# Patient Record
Sex: Male | Born: 1960 | Race: White | Hispanic: No | Marital: Single | Smoking: Current every day smoker
Health system: Southern US, Community
[De-identification: ages and names within clinical notes are randomized; demographics above are authoritative.]

## PROBLEM LIST (undated history)

## (undated) DIAGNOSIS — I10 Essential (primary) hypertension: Secondary | ICD-10-CM

## (undated) DIAGNOSIS — R7303 Prediabetes: Secondary | ICD-10-CM

## (undated) DIAGNOSIS — M1990 Unspecified osteoarthritis, unspecified site: Secondary | ICD-10-CM

## (undated) DIAGNOSIS — K219 Gastro-esophageal reflux disease without esophagitis: Secondary | ICD-10-CM

## (undated) HISTORY — PX: CARDIAC CATHETERIZATION: SHX172

---

## 2014-02-27 ENCOUNTER — Emergency Department: Payer: Self-pay | Admitting: Emergency Medicine

## 2018-06-08 ENCOUNTER — Observation Stay
Admission: EM | Admit: 2018-06-08 | Discharge: 2018-06-09 | Disposition: A | Discharge status: Home / Self Care | Payer: BLUE CROSS/BLUE SHIELD | Attending: Internal Medicine | Admitting: Internal Medicine

## 2018-06-08 ENCOUNTER — Other Ambulatory Visit: Payer: Self-pay

## 2018-06-08 ENCOUNTER — Emergency Department: Payer: BLUE CROSS/BLUE SHIELD

## 2018-06-08 ENCOUNTER — Encounter: Payer: Self-pay | Admitting: Emergency Medicine

## 2018-06-08 DIAGNOSIS — R0789 Other chest pain: Principal | ICD-10-CM | POA: Insufficient documentation

## 2018-06-08 DIAGNOSIS — R079 Chest pain, unspecified: Secondary | ICD-10-CM | POA: Diagnosis present

## 2018-06-08 DIAGNOSIS — Z7982 Long term (current) use of aspirin: Secondary | ICD-10-CM | POA: Insufficient documentation

## 2018-06-08 DIAGNOSIS — F17210 Nicotine dependence, cigarettes, uncomplicated: Secondary | ICD-10-CM | POA: Diagnosis not present

## 2018-06-08 DIAGNOSIS — E785 Hyperlipidemia, unspecified: Secondary | ICD-10-CM | POA: Insufficient documentation

## 2018-06-08 DIAGNOSIS — F419 Anxiety disorder, unspecified: Secondary | ICD-10-CM | POA: Diagnosis not present

## 2018-06-08 DIAGNOSIS — Z6833 Body mass index (BMI) 33.0-33.9, adult: Secondary | ICD-10-CM | POA: Insufficient documentation

## 2018-06-08 DIAGNOSIS — K219 Gastro-esophageal reflux disease without esophagitis: Secondary | ICD-10-CM | POA: Insufficient documentation

## 2018-06-08 DIAGNOSIS — E119 Type 2 diabetes mellitus without complications: Secondary | ICD-10-CM

## 2018-06-08 DIAGNOSIS — Z79899 Other long term (current) drug therapy: Secondary | ICD-10-CM | POA: Insufficient documentation

## 2018-06-08 DIAGNOSIS — I10 Essential (primary) hypertension: Secondary | ICD-10-CM | POA: Insufficient documentation

## 2018-06-08 HISTORY — DX: Prediabetes: R73.03

## 2018-06-08 HISTORY — DX: Gastro-esophageal reflux disease without esophagitis: K21.9

## 2018-06-08 HISTORY — DX: Unspecified osteoarthritis, unspecified site: M19.90

## 2018-06-08 HISTORY — DX: Essential (primary) hypertension: I10

## 2018-06-08 LAB — COMPREHENSIVE METABOLIC PANEL
ALT: 27 U/L (ref 0–44)
ANION GAP: 8 (ref 5–15)
AST: 21 U/L (ref 15–41)
Albumin: 4 g/dL (ref 3.5–5.0)
Alkaline Phosphatase: 75 U/L (ref 38–126)
BILIRUBIN TOTAL: 0.6 mg/dL (ref 0.3–1.2)
BUN: 19 mg/dL (ref 6–20)
CO2: 27 mmol/L (ref 22–32)
Calcium: 9.2 mg/dL (ref 8.9–10.3)
Chloride: 104 mmol/L (ref 98–111)
Creatinine, Ser: 1.03 mg/dL (ref 0.61–1.24)
GFR calc Af Amer: >60 mL/min (ref >60)
GFR calc non Af Amer: >60 mL/min (ref >60)
GLUCOSE: 86 mg/dL (ref 70–99)
POTASSIUM: 3.5 mmol/L (ref 3.5–5.1)
SODIUM: 139 mmol/L (ref 135–145)
TOTAL PROTEIN: 7.5 g/dL (ref 6.5–8.1)

## 2018-06-08 LAB — CBC WITH DIFFERENTIAL/PLATELET
Basophils Absolute: 0.1 10*3/uL (ref 0–0.1)
Basophils Relative: 1 %
EOS ABS: 0.4 10*3/uL (ref 0–0.7)
Eosinophils Relative: 4 %
HCT: 48.1 % (ref 40.0–52.0)
HEMOGLOBIN: 16.7 g/dL (ref 13.0–18.0)
LYMPHS ABS: 3.1 10*3/uL (ref 1.0–3.6)
LYMPHS PCT: 29 %
MCH: 31.6 pg (ref 26.0–34.0)
MCHC: 34.6 g/dL (ref 32.0–36.0)
MCV: 91.3 fL (ref 80.0–100.0)
MONOS PCT: 9 %
Monocytes Absolute: 0.9 10*3/uL (ref 0.2–1.0)
NEUTROS ABS: 6 10*3/uL (ref 1.4–6.5)
NEUTROS PCT: 57 %
Platelets: 237 10*3/uL (ref 150–440)
RBC: 5.27 MIL/uL (ref 4.40–5.90)
RDW: 13.7 % (ref 11.5–14.5)
WBC: 10.5 10*3/uL (ref 3.8–10.6)

## 2018-06-08 LAB — TROPONIN I
Troponin I: <0.03 ng/mL (ref <0.03)
Troponin I: <0.03 ng/mL (ref <0.03)

## 2018-06-08 LAB — BRAIN NATRIURETIC PEPTIDE: B Natriuretic Peptide: 29 pg/mL (ref 0.0–100.0)

## 2018-06-08 LAB — GLUCOSE, CAPILLARY: Glucose-Capillary: 174 mg/dL — ABNORMAL HIGH (ref 70–99)

## 2018-06-08 LAB — LIPASE, BLOOD: Lipase: 31 U/L (ref 11–51)

## 2018-06-08 IMAGING — CR DG CHEST 2V
2 series · 2 of 2 positions shown · non-contrast
Comparison: None.

CLINICAL DATA: Chest pain.

EXAM:
CHEST - 2 VIEW

[chest pa]
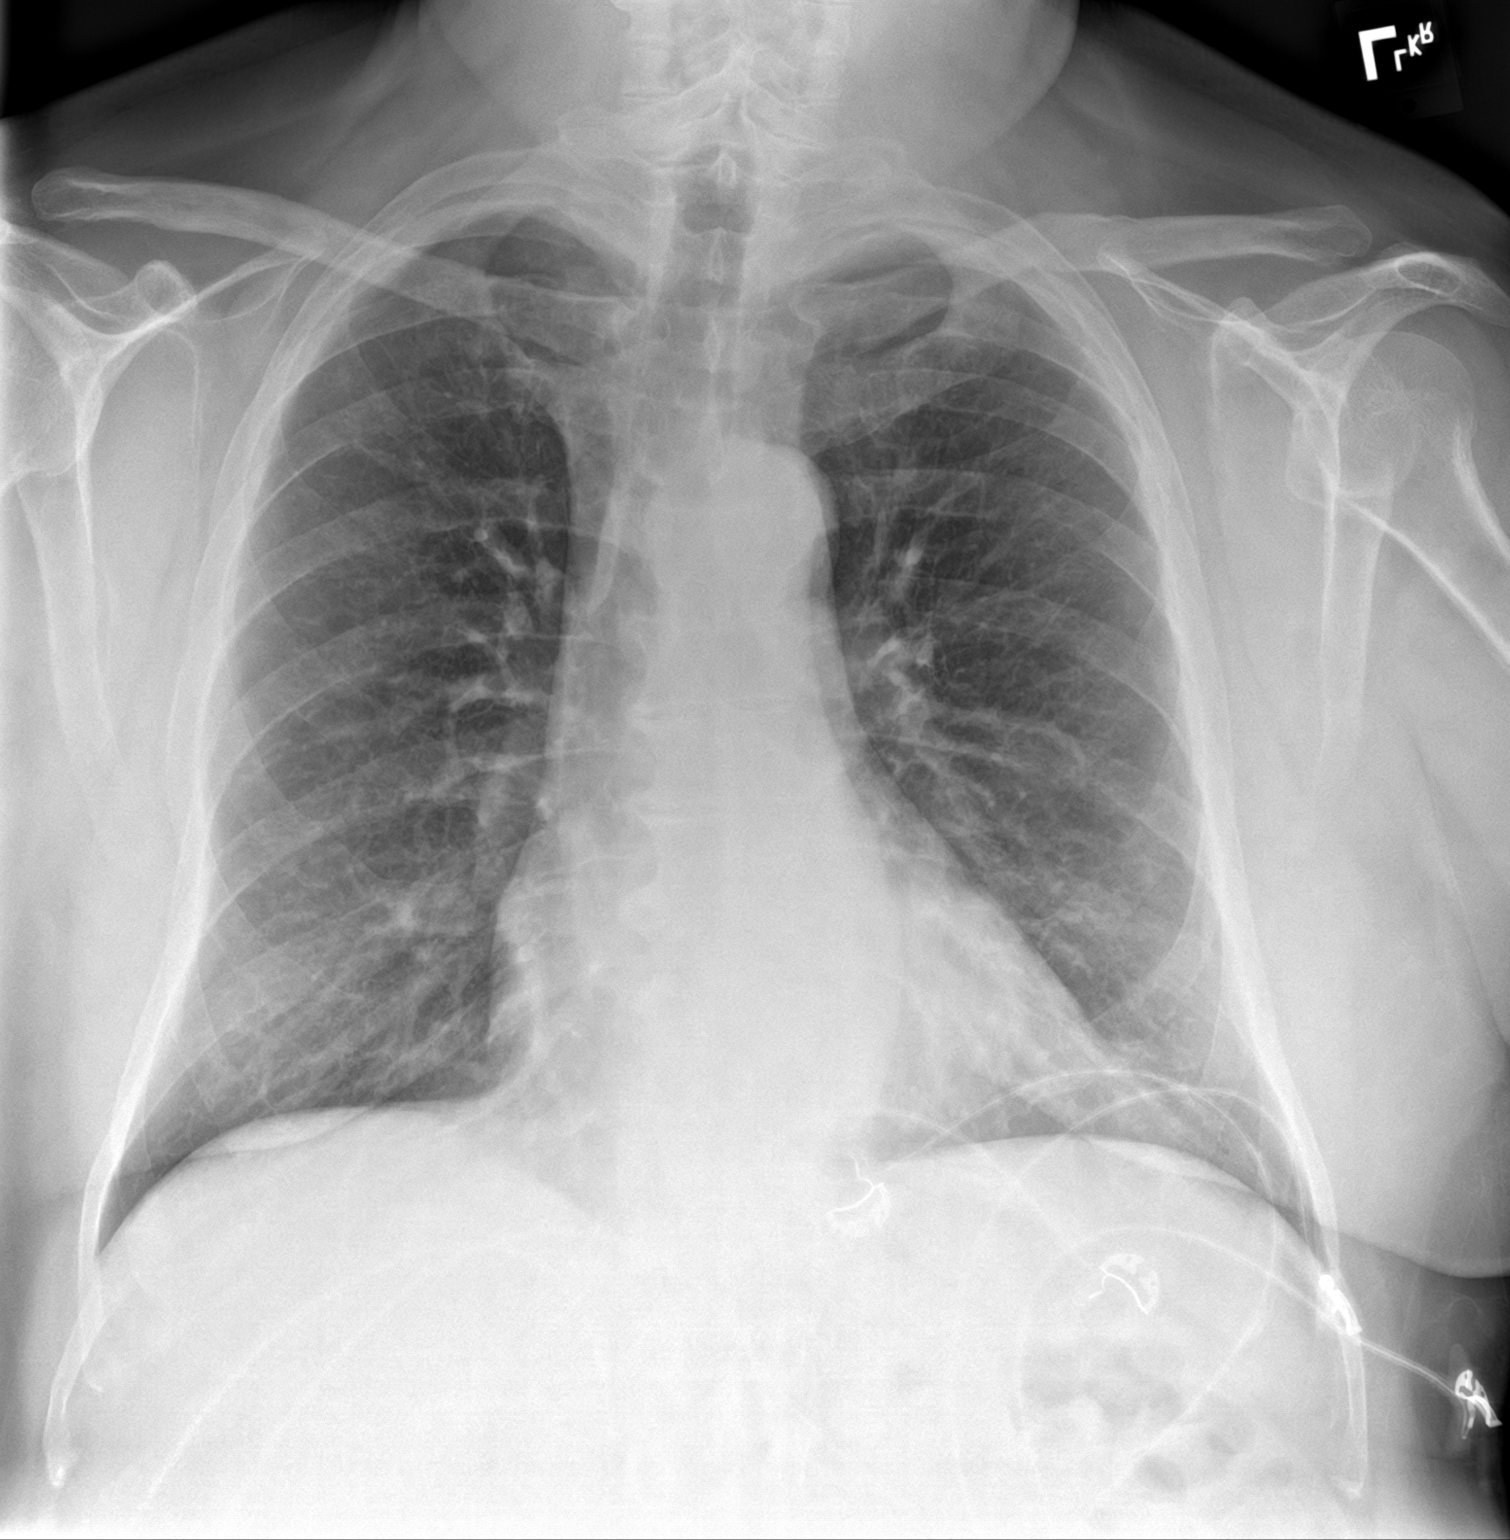

[chest lat]
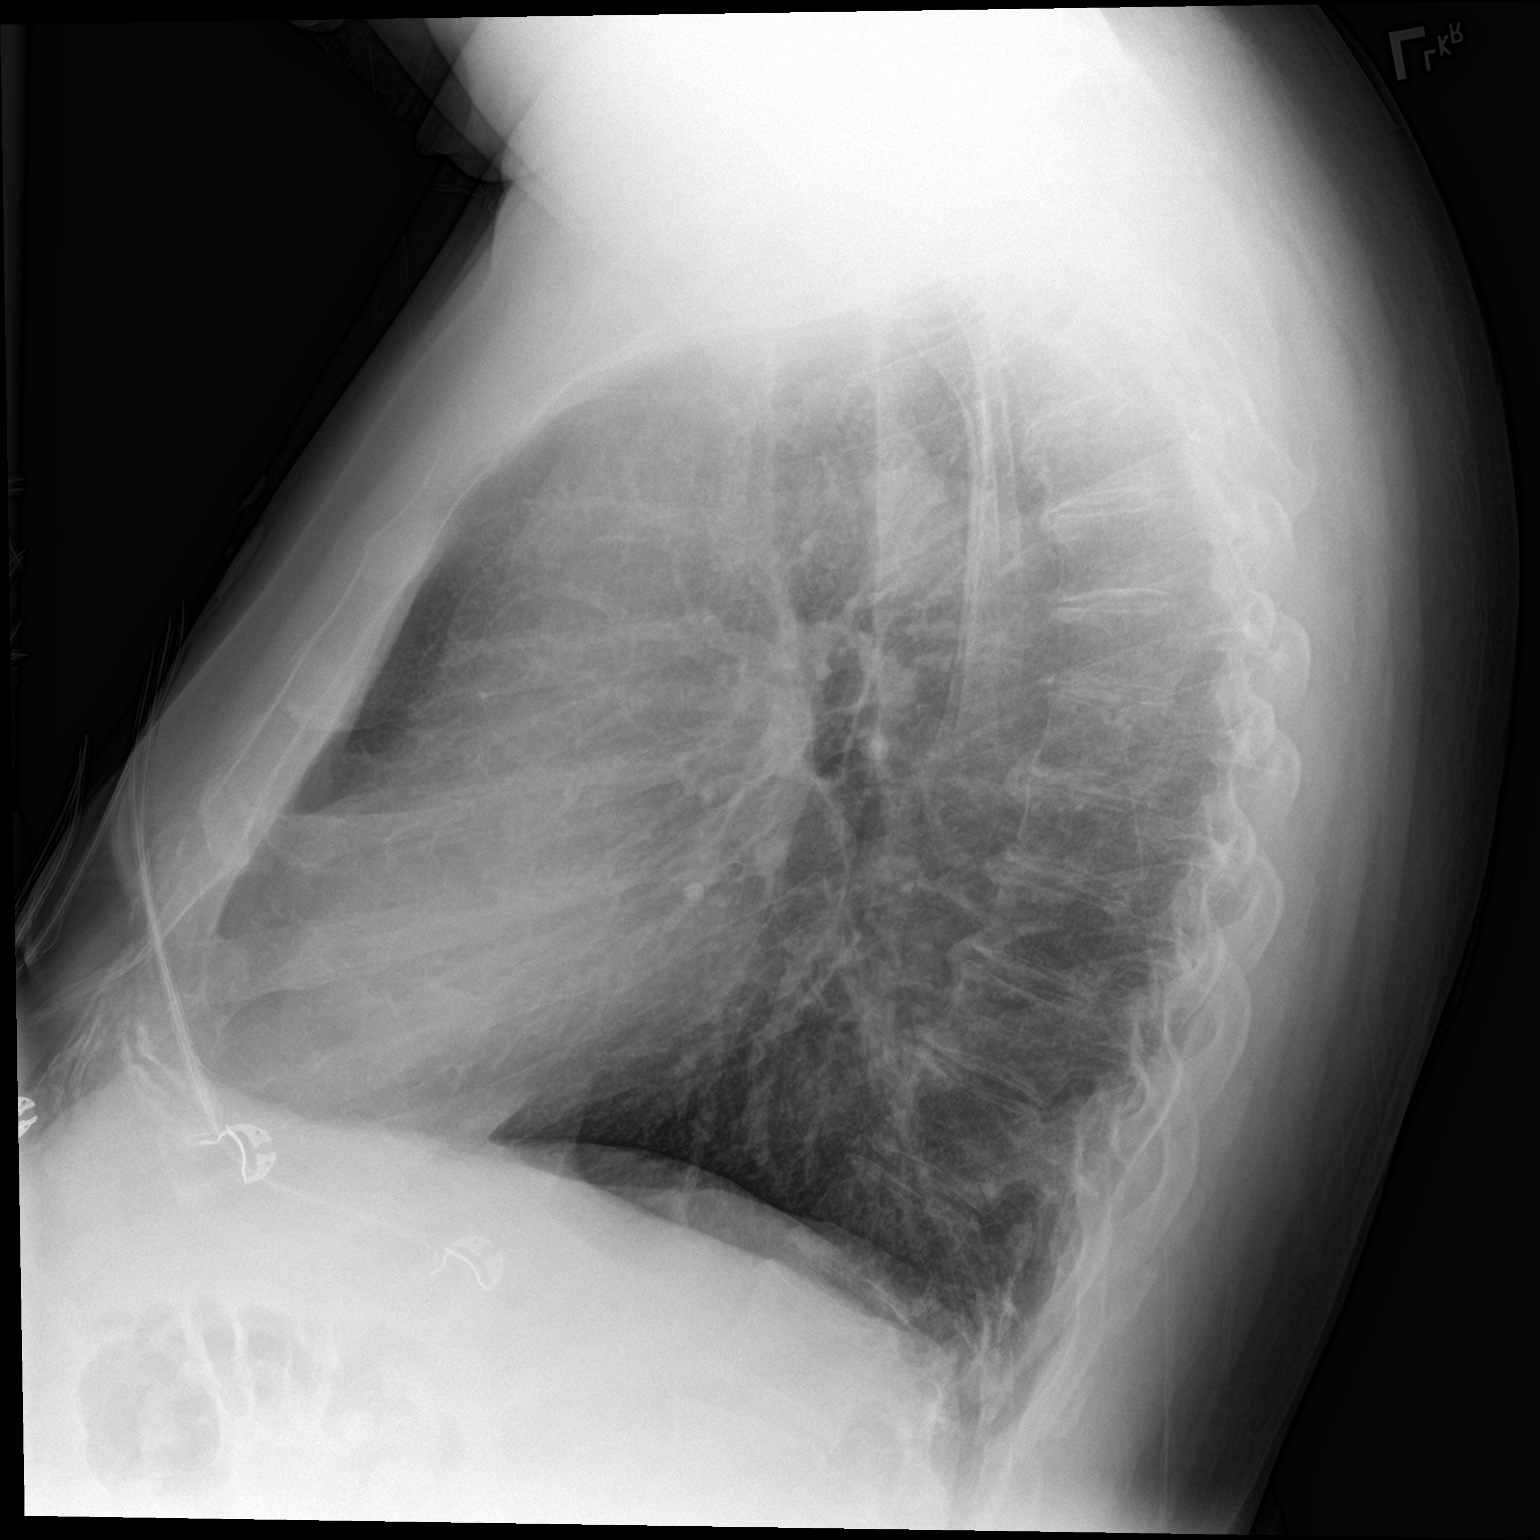

[2 of 2 positions shown; findings below may reference images not displayed]

FINDINGS: Heart is borderline enlarged. Normal mediastinal contours. Normal
pulmonary vascularity. No focal consolidation, pleural effusion, or
pneumothorax. No acute osseous abnormality.
IMPRESSION: No active cardiopulmonary disease.

## 2018-06-08 MED ORDER — ONDANSETRON HCL 4 MG/2ML IJ SOLN: 4.0000 mg | Freq: Four times a day (QID) | INTRAMUSCULAR | Status: DC | PRN

## 2018-06-08 MED ORDER — HYDROCHLOROTHIAZIDE 25 MG PO TABS
25.0000 mg | ORAL_TABLET | Freq: Every day | ORAL | Status: DC
  Administered 2018-06-09: 25 mg via ORAL
  Filled 2018-06-08: qty 1

## 2018-06-08 MED ORDER — NITROGLYCERIN 0.4 MG SL SUBL: 0.4000 mg | SUBLINGUAL_TABLET | SUBLINGUAL | Status: DC | PRN

## 2018-06-08 MED ORDER — ACETAMINOPHEN 325 MG PO TABS: 650.0000 mg | ORAL_TABLET | Freq: Four times a day (QID) | ORAL | Status: DC | PRN

## 2018-06-08 MED ORDER — PANTOPRAZOLE SODIUM 40 MG PO TBEC
40.0000 mg | DELAYED_RELEASE_TABLET | Freq: Every day | ORAL | Status: DC
  Administered 2018-06-09: 40 mg via ORAL
  Filled 2018-06-08: qty 1

## 2018-06-08 MED ORDER — ENOXAPARIN SODIUM 40 MG/0.4ML ~~LOC~~ SOLN
40.0000 mg | SUBCUTANEOUS | Status: DC
  Administered 2018-06-08: 40 mg via SUBCUTANEOUS
  Filled 2018-06-08: qty 0.4

## 2018-06-08 MED ORDER — ACETAMINOPHEN 650 MG RE SUPP: 650.0000 mg | Freq: Four times a day (QID) | RECTAL | Status: DC | PRN

## 2018-06-08 MED ORDER — SODIUM CHLORIDE 0.9 % IV SOLN: 250.0000 mL | INTRAVENOUS | Status: DC | PRN

## 2018-06-08 MED ORDER — SODIUM CHLORIDE 0.9 % IV BOLUS
500.0000 mL | Freq: Once | INTRAVENOUS | Status: AC
  Administered 2018-06-08: 500 mL via INTRAVENOUS

## 2018-06-08 MED ORDER — LOSARTAN POTASSIUM 50 MG PO TABS
100.0000 mg | ORAL_TABLET | Freq: Every day | ORAL | Status: DC
  Administered 2018-06-09: 100 mg via ORAL
  Filled 2018-06-08: qty 2

## 2018-06-08 MED ORDER — ONDANSETRON HCL 4 MG PO TABS: 4.0000 mg | ORAL_TABLET | Freq: Four times a day (QID) | ORAL | Status: DC | PRN

## 2018-06-08 MED ORDER — SODIUM CHLORIDE 0.9% FLUSH: 3.0000 mL | INTRAVENOUS | Status: DC | PRN

## 2018-06-08 MED ORDER — SODIUM CHLORIDE 0.9% FLUSH
3.0000 mL | Freq: Two times a day (BID) | INTRAVENOUS | Status: DC
  Administered 2018-06-08 – 2018-06-09 (×2): 3 mL via INTRAVENOUS

## 2018-06-08 MED ORDER — ASPIRIN EC 325 MG PO TBEC
325.0000 mg | DELAYED_RELEASE_TABLET | Freq: Every day | ORAL | Status: DC
  Administered 2018-06-09: 325 mg via ORAL
  Filled 2018-06-08: qty 1

## 2018-06-08 MED ORDER — SIMVASTATIN 20 MG PO TABS
20.0000 mg | ORAL_TABLET | Freq: Every day | ORAL | Status: DC
  Administered 2018-06-08: 20 mg via ORAL
  Filled 2018-06-08: qty 1

## 2018-06-08 NOTE — Plan of Care (Signed)
  Problem: Clinical Measurements: Goal: Diagnostic test results will improve Outcome: Progressing   

## 2018-06-08 NOTE — H&P (Addendum)
[facility] - [place] at [facility]   PATIENT NAME: Adonnis Salceda    MR#:  [id number]  DATE OF BIRTH:  11-27-1960  DATE OF ADMISSION:  06/08/2018  PRIMARY CARE PHYSICIAN: Lattie Haw, MD   REQUESTING/REFERRING PHYSICIAN: Merrily Brittle, MD  CHIEF COMPLAINT:   Chief Complaint  Patient presents with  . Chest Pain    HISTORY OF PRESENT ILLNESS: Yuvraj Pfeifer  is a 57 y.o. male with a known history of   Essential hypertension who is presenting with complaint of chest pain.  Patient states that he has been having chest pressure ongoing for the past few days.  He states that it occurs at rest and activity.  He denies any radiation the pain to his neck jaw no tingling or numbness    PAST MEDICAL HISTORY:   Past Medical History:  Diagnosis Date  . Hypertension     PAST SURGICAL HISTORY:  Past Surgical History:  Procedure Laterality Date  . CARDIAC CATHETERIZATION      SOCIAL HISTORY:  Social History   Tobacco Use  . Smoking status: Current Every Day Smoker    Types: Cigarettes  . Smokeless tobacco: Never Used  Substance Use Topics  . Alcohol use: Yes    Frequency: Never    Comment: social    FAMILY HISTORY:  Family History  Problem Relation Age of Onset  . Hypertension Mother     DRUG ALLERGIES: Not on File  REVIEW OF SYSTEMS:   CONSTITUTIONAL: No fever, fatigue or weakness.  EYES: No blurred or double vision.  EARS, NOSE, AND THROAT: No tinnitus or ear pain.  RESPIRATORY: No cough, shortness of breath, wheezing or hemoptysis.  CARDIOVASCULAR: Positive chest pressure, orthopnea, edema.  GASTROINTESTINAL: No nausea, vomiting, diarrhea or abdominal pain.  GENITOURINARY: No dysuria, hematuria.  ENDOCRINE: No polyuria, nocturia,  HEMATOLOGY: No anemia, easy bruising or bleeding SKIN: No rash or lesion. MUSCULOSKELETAL: No joint pain or arthritis.   NEUROLOGIC: No tingling, numbness, weakness.  PSYCHIATRY: No anxiety or depression.    MEDICATIONS AT HOME:  Prior to Admission medications   Medication Sig Start Date End Date Taking? Authorizing Provider  aspirin EC 81 MG tablet Take 81 mg by mouth daily.   Yes [provider]  hydrochlorothiazide (HYDRODIURIL) 25 MG tablet Take 1 tablet by mouth daily. 06/03/18  Yes [provider]  losartan (COZAAR) 100 MG tablet Take 1 tablet by mouth daily. 06/03/18  Yes [provider]  omeprazole (PRILOSEC) 40 MG capsule Take 1 capsule by mouth daily. 06/07/18  Yes [provider]  simvastatin (ZOCOR) 20 MG tablet Take 1 tablet by mouth at bedtime. 06/03/18  Yes [provider]      PHYSICAL EXAMINATION:   VITAL SIGNS: Blood pressure 132/72, pulse 67, temperature 98.2 F (36.8 C), temperature source Oral, resp. rate (!) 22, height 5\' 10"  (1.778 m), weight 95.3 kg, SpO2 93 %.  GENERAL:  57 y.o.-year-old patient lying in the bed with no acute distress.  EYES: Pupils equal, round, reactive to light and accommodation. No scleral icterus. Extraocular muscles intact.  HEENT: Head atraumatic, normocephalic. Oropharynx and nasopharynx clear.  NECK:  Supple, no jugular venous distention. No thyroid enlargement, no tenderness.  LUNGS: Normal breath sounds bilaterally, no wheezing, rales,rhonchi or crepitation. No use of accessory muscles of respiration.  CARDIOVASCULAR: S1, S2 normal. No murmurs, rubs, or gallops.  ABDOMEN: Soft, nontender, nondistended. Bowel sounds present. No organomegaly or mass.  EXTREMITIES: No pedal edema, cyanosis, or clubbing.  NEUROLOGIC: Cranial nerves II through XII are intact. Muscle strength 5/5 in all extremities. Sensation intact. Gait not checked.  PSYCHIATRIC: The patient is alert and oriented x 3.  SKIN: No obvious rash, lesion, or ulcer.   LABORATORY PANEL:   CBC Recent Labs  Lab 06/08/18 1525  WBC 10.5  HGB 16.7  HCT 48.1  PLT 237  MCV 91.3  MCH 31.6  MCHC 34.6  RDW 13.7  LYMPHSABS 3.1  MONOABS  0.9  EOSABS 0.4  BASOSABS 0.1   ------------------------------------------------------------------------------------------------------------------  Chemistries  Recent Labs  Lab 06/08/18 1525  NA 139  K 3.5  CL 104  CO2 27  GLUCOSE 86  BUN 19  CREATININE 1.03  CALCIUM 9.2  AST 21  ALT 27  ALKPHOS 75  BILITOT 0.6   ------------------------------------------------------------------------------------------------------------------ estimated creatinine clearance is 90.6 mL/min (by C-G formula based on SCr of 1.03 mg/dL). ------------------------------------------------------------------------------------------------------------------ No results for input(s): TSH, T4TOTAL, T3FREE, THYROIDAB in the last 72 hours.  Invalid input(s): FREET3   Coagulation profile No results for input(s): INR, PROTIME in the last 168 hours. ------------------------------------------------------------------------------------------------------------------- No results for input(s): DDIMER in the last 72 hours. -------------------------------------------------------------------------------------------------------------------  Cardiac Enzymes Recent Labs  Lab 06/08/18 1525  TROPONINI <0.03   ------------------------------------------------------------------------------------------------------------------ Invalid input(s): POCBNP  ---------------------------------------------------------------------------------------------------------------  Urinalysis No results found for: COLORURINE, APPEARANCEUR, LABSPEC, PHURINE, GLUCOSEU, HGBUR, BILIRUBINUR, KETONESUR, PROTEINUR, UROBILINOGEN, NITRITE, LEUKOCYTESUR   RADIOLOGY: Dg Chest 2 View  Result Date: 06/08/2018 CLINICAL DATA:  Chest pain. EXAM: CHEST - 2 VIEW COMPARISON:  None. FINDINGS: Heart is borderline enlarged. Normal mediastinal contours. Normal pulmonary vascularity. No focal consolidation, pleural effusion, or pneumothorax. No acute osseous  abnormality. IMPRESSION: No active cardiopulmonary disease. Electronically Signed   By: Obie DredgeWilliam T Derry M.D.   On: 06/08/2018 15:53    EKG: Orders placed or performed during the hospital encounter of 06/08/18  . EKG 12-Lead  . EKG 12-Lead  . Repeat EKG  . Repeat EKG  . EKG 12-Lead  . EKG 12-Lead    IMPRESSION AND PLAN: Patient is 57 year old presenting with complaint of chest pressure  1.  Chest pressure we will place patient under observation overnight We will cycle cardiac enzymes Stress test in the morning  2.  Essential hypertension continue therapy with HCTZ, losartan  3.  GERD continue Prilosec  4.  Hyperlipidemia continue Zocor  5.  Miscellaneous Lovenox for DVT prophylaxis  6.  Nicotine abuse smoking cessation provided 4 minutes spent strongly recommend patient stop smoking nicotine patch was offered patient does not want a nicotine patch  All the records are reviewed and case discussed with ED provider. Management plans discussed with the patient, family and they are in agreement.  CODE STATUS: full    TOTAL TIME TAKING CARE OF THIS PATIENT:6555minutes.    Auburn BilberryShreyang Sharod Petsch M.D on 06/08/2018 at 5:11 PM  Between 7am to 6pm - Pager - [phone]  After 6pm go to [email or web address] - password [email or web address]  [facility] Office  [phone]  CC: Primary care physician; Lattie HawStubbs, Sarah L, MD

## 2018-06-08 NOTE — ED Triage Notes (Addendum)
Pt arrived with complaints of chest pain that's in the center of his chest. Pt was given 4 baby asa and 1 NTG prior to arrival. Pt appears in NAD. Pt states the NTG relieved his pain to a minimal number.

## 2018-06-08 NOTE — ED Notes (Signed)
Floor unable to take report at this time.

## 2018-06-08 NOTE — Plan of Care (Signed)

## 2018-06-08 NOTE — ED Provider Notes (Signed)
[facility] Emergency Department Provider Note  ____________________________________________   First MD Initiated Contact with Patient 06/08/18 1523     (approximate)  I have reviewed the triage vital signs and the nursing notes.   HISTORY  Chief Complaint Chest Pain   HPI Edward Mitchell is a 57 y.o. male comes the emergency department via EMS with pressure-like chest pain for the past 3 days.  His pain is substernal aching pressure-like moderate severity.  It is intermittent but he cannot pinpoint anything in particular that seems to make it better or worse aside from the nitroglycerin he received prehospital.  He has a past medical history of diabetes mellitus as well as hypertension and says that about 8 or 9 years ago he had "a blocked coronary artery that they did something with a balloon".  He has no history of DVT or pulmonary embolism.  No leg swelling.  No recent surgery travel or immobilization.  EMS did give him 324 mg of aspirin.  His pain is associated with nausea but no diaphoresis.  The pain is not ripping or tearing it does not go straight to his back.    Past Medical History:  Diagnosis Date  . Hypertension     Patient Active Problem List   Diagnosis Date Noted  . Chest pain 06/08/2018    Past Surgical History:  Procedure Laterality Date  . CARDIAC CATHETERIZATION      Prior to Admission medications   Medication Sig Start Date End Date Taking? Authorizing Provider  aspirin EC 81 MG tablet Take 81 mg by mouth daily.   Yes [provider]  hydrochlorothiazide (HYDRODIURIL) 25 MG tablet Take 1 tablet by mouth daily. 06/03/18  Yes [provider]  losartan (COZAAR) 100 MG tablet Take 1 tablet by mouth daily. 06/03/18  Yes [provider]  omeprazole (PRILOSEC) 40 MG capsule Take 1 capsule by mouth daily. 06/07/18  Yes [provider]  simvastatin (ZOCOR) 20 MG tablet Take 1 tablet by mouth at bedtime.  06/03/18  Yes [provider]    Allergies Patient has no allergy information on record.  Family History  Problem Relation Age of Onset  . Hypertension Mother     Social History Social History   Tobacco Use  . Smoking status: Current Every Day Smoker    Types: Cigarettes  . Smokeless tobacco: Never Used  Substance Use Topics  . Alcohol use: Yes    Frequency: Never    Comment: social  . Drug use: Never    Review of Systems Constitutional: No fever/chills Eyes: No visual changes. ENT: No sore throat. Cardiovascular: Positive for chest pain. Respiratory: Denies shortness of breath. Gastrointestinal: No abdominal pain.  Positive for nausea, no vomiting.  No diarrhea.  No constipation. Genitourinary: Negative for dysuria. Musculoskeletal: Negative for back pain. Skin: Negative for rash. Neurological: Negative for headaches, focal weakness or numbness.   ____________________________________________   PHYSICAL EXAM:  VITAL SIGNS: ED Triage Vitals  Enc Vitals Group     BP --      Pulse Rate 06/08/18 1520 93     Resp 06/08/18 1520 16     Temp --      Temp Source 06/08/18 1520 Oral     SpO2 06/08/18 1520 100 %     Weight 06/08/18 1519 210 lb (95.3 kg)     Height 06/08/18 1519 5\' 10"  (1.778 m)     Head Circumference --      Peak Flow --  Pain Score 06/08/18 1519 2     Pain Loc --      Pain Edu? --      Excl. in GC? --     Constitutional: Alert and oriented x4 somewhat anxious appearing holding his chest and a Levine sign Eyes: PERRL EOMI. Head: Atraumatic. Nose: No congestion/rhinnorhea. Mouth/Throat: No trismus Neck: No stridor.   Cardiovascular: Normal rate, regular rhythm. Grossly normal heart sounds.  Good peripheral circulation. Respiratory: Slightly increased respiratory effort.  No retractions. Lungs CTAB and moving good air Gastrointestinal: Ridley obese soft nontender Musculoskeletal: No lower extremity edema   Neurologic:  Normal  speech and language. No gross focal neurologic deficits are appreciated. Skin:  Skin is warm, dry and intact. No rash noted. Psychiatric: Mood and affect are normal. Speech and behavior are normal.    ____________________________________________   DIFFERENTIAL includes but not limited to  STEMI, non-STEMI, unstable angina, stable angina, pulmonary embolism, pneumothorax ____________________________________________   LABS (all labs ordered are listed, but only abnormal results are displayed)  Labs Reviewed  LIPASE, BLOOD  COMPREHENSIVE METABOLIC PANEL  BRAIN NATRIURETIC PEPTIDE  TROPONIN I  CBC WITH DIFFERENTIAL/PLATELET    Work reviewed by me with no acute disease __________________________________________  EKG  ED ECG REPORT I, Merrily Brittle, the attending physician, personally viewed and interpreted this ECG.  Date: 06/08/2018 EKG Time:  Rate: 88 Rhythm: normal sinus rhythm QRS Axis: normal Intervals: normal ST/T Wave abnormalities: normal Narrative Interpretation: no evidence of acute ischemia.  Normal sinus rhythm with multiple premature ventricular contractions  ____________________________________________  RADIOLOGY  Chest x-ray reviewed by me with no acute disease ____________________________________________   PROCEDURES  Procedure(s) performed: no  Procedures  Critical Care performed: no  ____________________________________________   INITIAL IMPRESSION / ASSESSMENT AND PLAN / ED COURSE  Pertinent labs & imaging results that were available during my care of the patient were reviewed by me and considered in my medical decision making (see chart for details).   As part of my medical decision making, I reviewed the following data within the electronic MEDICAL RECORD NUMBER History obtained from family if available, nursing notes, old chart and ekg, as well as notes from prior ED visits.  The patient arrives somewhat uncomfortable appearing with  pressure-like chest pain alleviated by nitroglycerin and a known history of coronary artery disease.  He was given 324 mg of aspirin prior to transport.  He has multiple risk factors including morbid obesity, hypertension, diabetes mellitus, and previous coronary artery disease.  His first EKG is nonischemic although with multiple premature ventricular contractions.  I discussed with the patient and as his heart score is at least 4 pending first troponin I do believe he requires inpatient admission for full risk stratification.     ----------------------------------------- 4:43 PM on 06/08/2018 -----------------------------------------  Fortunately the patient's first troponin is negative.  I discussed with the patient that he has multiple risk factors I do believe he would benefit from inpatient risk stratification.  He agrees to stay.  I spoke with the hospitalist who is graciously agreed to admit the patient to his service. ____________________________________________   FINAL CLINICAL IMPRESSION(S) / ED DIAGNOSES  Final diagnoses:  Chest pain, unspecified type  Type 2 diabetes mellitus without complication, without long-term current use of insulin (HCC)  Morbid obesity (HCC)  Hypertension, unspecified type      NEW MEDICATIONS STARTED DURING THIS VISIT:  New Prescriptions   No medications on file     Note:  This document was prepared using  [employer] engineervoice recognition software and may include unintentional dictation errors.     Merrily Brittleifenbark, Harjit Douds, MD 06/08/18 [phone]

## 2018-06-09 ENCOUNTER — Observation Stay (HOSPITAL_BASED_OUTPATIENT_CLINIC_OR_DEPARTMENT_OTHER): Payer: BLUE CROSS/BLUE SHIELD

## 2018-06-09 ENCOUNTER — Encounter: Payer: Self-pay | Admitting: Radiology

## 2018-06-09 DIAGNOSIS — R079 Chest pain, unspecified: Secondary | ICD-10-CM | POA: Diagnosis not present

## 2018-06-09 LAB — LIPID PANEL
CHOL/HDL RATIO: 4.4 ratio
CHOLESTEROL: 124 mg/dL (ref 0–200)
HDL: 28 mg/dL — AB (ref >40)
LDL Cholesterol: 76 mg/dL (ref 0–99)
TRIGLYCERIDES: 98 mg/dL (ref <150)
VLDL: 20 mg/dL (ref 0–40)

## 2018-06-09 LAB — NM MYOCAR MULTI W/SPECT W/WALL MOTION / EF
CHL CUP RESTING HR STRESS: 67 {beats}/min
CSEPHR: 62 %
LVDIAVOL: 97 mL (ref 62–150)
LVSYSVOL: 37 mL
Peak HR: 102 {beats}/min
SDS: 0
SRS: 0
SSS: 1
TID: 1.12

## 2018-06-09 LAB — TROPONIN I
Troponin I: <0.03 ng/mL (ref <0.03)
Troponin I: <0.03 ng/mL (ref <0.03)

## 2018-06-09 MED ORDER — TECHNETIUM TC 99M TETROFOSMIN IV KIT
32.3700 | PACK | Freq: Once | INTRAVENOUS | Status: AC | PRN
  Administered 2018-06-09: 32.37 via INTRAVENOUS

## 2018-06-09 MED ORDER — REGADENOSON 0.4 MG/5ML IV SOLN
0.4000 mg | Freq: Once | INTRAVENOUS | Status: AC
  Administered 2018-06-09: 0.4 mg via INTRAVENOUS
  Filled 2018-06-09: qty 5

## 2018-06-09 MED ORDER — TECHNETIUM TC 99M TETROFOSMIN IV KIT
13.1500 | PACK | Freq: Once | INTRAVENOUS | Status: AC | PRN
  Administered 2018-06-09: 13.15 via INTRAVENOUS

## 2018-06-09 NOTE — Progress Notes (Signed)
[facility] - [place] at [facility]   PATIENT NAME: Edward Mitchell    MR#:  [id number]  DATE OF BIRTH:  06-09-1961  SUBJECTIVE:  CHIEF COMPLAINT:   Chief Complaint  Patient presents with  . Chest Pain   -Chest pain on admission.  None currently.  Had a stress test this morning  REVIEW OF SYSTEMS:  Review of Systems  Constitutional: Negative for chills and fever.  HENT: Negative for congestion, ear discharge, hearing loss and nosebleeds.   Eyes: Negative for blurred vision and double vision.  Respiratory: Negative for cough, shortness of breath and wheezing.   Cardiovascular: Negative for chest pain and palpitations.  Gastrointestinal: Negative for abdominal pain, constipation, diarrhea, nausea and vomiting.  Genitourinary: Negative for dysuria and urgency.  Musculoskeletal: Negative for myalgias.  Neurological: Negative for dizziness, tingling, tremors, sensory change, seizures and headaches.  Psychiatric/Behavioral: Negative for depression.    DRUG ALLERGIES:  No Known Allergies  VITALS:  Blood pressure (!) 144/84, pulse 85, temperature 98.4 F (36.9 C), temperature source Oral, resp. rate 19, height 6' (1.829 m), weight 110.7 kg, SpO2 94 %.  PHYSICAL EXAMINATION:  Physical Exam  GENERAL:  57 y.o.-year-old patient lying in the bed with no acute distress.  EYES: Pupils equal, round, reactive to light and accommodation. No scleral icterus. Extraocular muscles intact.  HEENT: Head atraumatic, normocephalic. Oropharynx and nasopharynx clear.  NECK:  Supple, no jugular venous distention. No thyroid enlargement, no tenderness.  LUNGS: Normal breath sounds bilaterally, no wheezing, rales,rhonchi or crepitation. No use of accessory muscles of respiration.  CARDIOVASCULAR: S1, S2 normal. No murmurs, rubs, or gallops.  ABDOMEN: Soft, nontender, nondistended. Bowel sounds present. No organomegaly or mass.  EXTREMITIES: No pedal edema, cyanosis, or clubbing.    NEUROLOGIC: Cranial nerves II through XII are intact. Muscle strength 5/5 in all extremities. Sensation intact. Gait not checked.  PSYCHIATRIC: The patient is alert and oriented x 3.  SKIN: No obvious rash, lesion, or ulcer.    LABORATORY PANEL:   CBC Recent Labs  Lab 06/08/18 1525  WBC 10.5  HGB 16.7  HCT 48.1  PLT 237   ------------------------------------------------------------------------------------------------------------------  Chemistries  Recent Labs  Lab 06/08/18 1525  NA 139  K 3.5  CL 104  CO2 27  GLUCOSE 86  BUN 19  CREATININE 1.03  CALCIUM 9.2  AST 21  ALT 27  ALKPHOS 75  BILITOT 0.6   ------------------------------------------------------------------------------------------------------------------  Cardiac Enzymes Recent Labs  Lab 06/09/18 0650  TROPONINI <0.03   ------------------------------------------------------------------------------------------------------------------  RADIOLOGY:  Dg Chest 2 View  Result Date: 06/08/2018 CLINICAL DATA:  Chest pain. EXAM: CHEST - 2 VIEW COMPARISON:  None. FINDINGS: Heart is borderline enlarged. Normal mediastinal contours. Normal pulmonary vascularity. No focal consolidation, pleural effusion, or pneumothorax. No acute osseous abnormality. IMPRESSION: No active cardiopulmonary disease. Electronically Signed   By: Obie Dredge M.D.   On: 06/08/2018 15:53    EKG:   Orders placed or performed during the hospital encounter of 06/08/18  . EKG 12-Lead  . EKG 12-Lead  . Repeat EKG  . Repeat EKG  . EKG 12-Lead  . EKG 12-Lead    ASSESSMENT AND PLAN:   57 year old male with past medical history significant for hypertension and tobacco use comes to hospital secondary to chest pain  1.  Chest pain-either stress related/anxiety or stable angina -Prior history of angioplasty without stent placement several years ago according to patient. -Troponins negative.  No further chest pain -Status post stress  test this  morning.  If negative, can be discharged home  2.  Hypertension-continue hydrochlorothiazide and losartan  3.  Hyperlipidemia-statin  4.  GERD-Protonix  5.  DVT prophylaxis-Lovenox  Possible discharge home today if stress test is negative   All the records are reviewed and case discussed with Care Management/Social Workerr. Management plans discussed with the patient, family and they are in agreement.  CODE STATUS: Full code  TOTAL TIME TAKING CARE OF THIS PATIENT: 38 minutes.   POSSIBLE D/C IN 1-2 DAYS, DEPENDING ON CLINICAL CONDITION.   Enid BaasKALISETTI,Shelle Galdamez M.D on 06/09/2018 at 2:19 PM  Between 7am to 6pm - Pager - [phone]  After 6pm go to [email or web address] - password [employer] [email or web address]  [facility] [place] [facility]  Office  [phone]  CC: Primary care physician; Lattie HawStubbs, Sarah L, MD

## 2018-06-09 NOTE — Care Management (Signed)
Patient placed in observation with chest pain.  Troponins are negative. No prior history cardiac Stress test pending. Anticipate discharge if negative.  Patient presents from home and independent in all adls.   No discharge needs identified at present time.

## 2018-06-10 NOTE — Discharge Summary (Signed)
[facility] -  at [facility]   PATIENT NAME: Edward Mitchell    MR#:  [id number]  DATE OF BIRTH:  28-Aug-1961  DATE OF ADMISSION:  06/08/2018   ADMITTING PHYSICIAN: Auburn Bilberry, MD  DATE OF DISCHARGE: 06/09/2018  5:38 PM  PRIMARY CARE PHYSICIAN: Lattie Haw, MD   ADMISSION DIAGNOSIS:   Morbid obesity (HCC) [E66.01] Type 2 diabetes mellitus without complication, without long-term current use of insulin (HCC) [E11.9] Chest pain, unspecified type [R07.9] Hypertension, unspecified type [I10]  DISCHARGE DIAGNOSIS:   Active Problems:   Chest pain   SECONDARY DIAGNOSIS:   Past Medical History:  Diagnosis Date  . Arthritis   . GERD (gastroesophageal reflux disease)   . Hypertension   . Pre-diabetes     HOSPITAL COURSE:   57 year old male with past medical history significant for hypertension and tobacco use comes to hospital secondary to chest pain  1.  Chest pain-either stress related/anxiety  -Prior history of angioplasty without stent placement several years ago according to patient. -Troponins negative.  No further chest pain -Status post stress test and it was a low risk scan  2.  Hypertension-continue hydrochlorothiazide and losartan  3.  Hyperlipidemia-statin  4.  GERD-Protonix  No further complaints.  Ambulating fine.  Will be discharged home   DISCHARGE CONDITIONS:   Guarded  CONSULTS OBTAINED:   None  DRUG ALLERGIES:   No Known Allergies DISCHARGE MEDICATIONS:   Allergies as of 06/09/2018   No Known Allergies     Medication List    TAKE these medications   aspirin EC 81 MG tablet Take 81 mg by mouth daily.   hydrochlorothiazide 25 MG tablet Commonly known as:  HYDRODIURIL Take 1 tablet by mouth daily.   losartan 100 MG tablet Commonly known as:  COZAAR Take 1 tablet by mouth daily.   omeprazole 40 MG capsule Commonly known as:  PRILOSEC Take 1 capsule by mouth daily.   simvastatin 20 MG  tablet Commonly known as:  ZOCOR Take 1 tablet by mouth at bedtime.        DISCHARGE INSTRUCTIONS:   1. PCP f/u in 1-2 weeks  DIET:   Cardiac diet  ACTIVITY:   Activity as tolerated  OXYGEN:   Home Oxygen: No.  Oxygen Delivery: room air  DISCHARGE LOCATION:   home   If you experience worsening of your admission symptoms, develop shortness of breath, life threatening emergency, suicidal or homicidal thoughts you must seek medical attention immediately by calling 911 or calling your MD immediately  if symptoms less severe.  You Must read complete instructions/literature along with all the possible adverse reactions/side effects for all the Medicines you take and that have been prescribed to you. Take any new Medicines after you have completely understood and accpet all the possible adverse reactions/side effects.   Please note  You were cared for by a hospitalist during your hospital stay. If you have any questions about your discharge medications or the care you received while you were in the hospital after you are discharged, you can call the unit and asked to speak with the hospitalist on call if the hospitalist that took care of you is not available. Once you are discharged, your primary care physician will handle any further medical issues. Please note that NO REFILLS for any discharge medications will be authorized once you are discharged, as it is imperative that you return to your primary care physician (or establish a relationship with a primary care physician  if you do not have one) for your aftercare needs so that they can reassess your need for medications and monitor your lab values.    On the day of Discharge:  VITAL SIGNS:   Blood pressure (!) 144/84, pulse 85, temperature 98.4 F (36.9 C), temperature source Oral, resp. rate 19, height 6' (1.829 m), weight 110.7 kg, SpO2 94 %.  PHYSICAL EXAMINATION:   GENERAL:  57 y.o.-year-old patient lying in the bed  with no acute distress.  EYES: Pupils equal, round, reactive to light and accommodation. No scleral icterus. Extraocular muscles intact.  HEENT: Head atraumatic, normocephalic. Oropharynx and nasopharynx clear.  NECK:  Supple, no jugular venous distention. No thyroid enlargement, no tenderness.  LUNGS: Normal breath sounds bilaterally, no wheezing, rales,rhonchi or crepitation. No use of accessory muscles of respiration.  CARDIOVASCULAR: S1, S2 normal. No murmurs, rubs, or gallops.  ABDOMEN: Soft, nontender, nondistended. Bowel sounds present. No organomegaly or mass.  EXTREMITIES: No pedal edema, cyanosis, or clubbing.  NEUROLOGIC: Cranial nerves II through XII are intact. Muscle strength 5/5 in all extremities. Sensation intact. Gait not checked.  PSYCHIATRIC: The patient is alert and oriented x 3.  SKIN: No obvious rash, lesion, or ulcer.    DATA REVIEW:   CBC Recent Labs  Lab 06/08/18 1525  WBC 10.5  HGB 16.7  HCT 48.1  PLT 237    Chemistries  Recent Labs  Lab 06/08/18 1525  NA 139  K 3.5  CL 104  CO2 27  GLUCOSE 86  BUN 19  CREATININE 1.03  CALCIUM 9.2  AST 21  ALT 27  ALKPHOS 75  BILITOT 0.6     Microbiology Results  No results found for this or any previous visit.  RADIOLOGY:  No results found.   Management plans discussed with the patient, family and they are in agreement.  CODE STATUS:  Code Status History    Date Active Date Inactive Code Status Order ID Comments User Context   06/08/2018 1755 06/09/2018 2048 Full Code [id number]  Auburn BilberryPatel, Shreyang, MD Inpatient      TOTAL TIME TAKING CARE OF THIS PATIENT: 38 minutes.    Enid BaasKALISETTI,Tyana Butzer M.D on 06/10/2018 at 2:17 PM  Between 7am to 6pm - Pager - [phone]  After 6pm go to [email or web address] - Social research officer, governmentpassword [email or web address]  [facility]  Office  [phone]  CC: Primary care physician; Lattie HawStubbs, Sarah L, MD   Note: This dictation was prepared with Dragon dictation along with  smaller phrase technology. Any transcriptional errors that result from this process are unintentional.
